# Patient Record
Sex: Male | Born: 2000 | Race: Black or African American | Hispanic: No | Marital: Single
Health system: Midwestern US, Community
[De-identification: ages and names within clinical notes are randomized; demographics above are authoritative.]

---
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
## 2020-01-22 ENCOUNTER — Emergency Department: Admit: 2020-01-22 | Payer: MEDICAID

## 2020-01-22 ENCOUNTER — Inpatient Hospital Stay
Admit: 2020-01-22 | Discharge: 2020-01-23 | Disposition: A | Discharge status: Home / Self Care | Payer: MEDICAID | Attending: Emergency Medicine

## 2020-01-22 DIAGNOSIS — M79662 Pain in left lower leg: Secondary | ICD-10-CM

## 2020-01-22 MED ORDER — IBUPROFEN 800 MG TAB
800 mg | ORAL_TABLET | Freq: Three times a day (TID) | ORAL | 0 refills | Status: AC
Start: 2020-01-22 — End: 2020-01-27

## 2020-01-22 NOTE — ED Provider Notes (Addendum)
EMERGENCY DEPARTMENT HISTORY AND PHYSICAL EXAM    Date: 01/22/2020  Patient Name: Cody Moore    History of Presenting Illness     Chief Complaint   Patient presents with   ??? Leg Pain         History Provided By: Patient      Additional History (Context): Cody Moore is a 19 y.o. male with No significant past medical history who presents with complaint of left calf pain for 2 weeks.  Denies shortness of breath or chest pain or injury.  Had a PVL performed in [facility] at onset of symptoms but is still symptomatic.  Pain worse w/ambulating.    PCP: None        Past History     Past Medical History:  No past medical history on file.    Past Surgical History:  No past surgical history on file.    Family History:  No family history on file.    Social History:  Social History     Tobacco Use   ??? Smoking status: Not on file   Substance Use Topics   ??? Alcohol use: Not on file   ??? Drug use: Not on file       Allergies:  No Known Allergies      Review of Systems   Review of Systems   Respiratory: Negative for shortness of breath.    Cardiovascular: Negative for chest pain.   Musculoskeletal: Positive for gait problem and myalgias.   Skin: Negative for color change, rash and wound.   Neurological: Negative for weakness and numbness.     All Other Systems Negative  Physical Exam     Vitals:    01/22/20 1556   BP: 129/77   Pulse: 81   Resp: 16   Temp: 98.6 ??F (37 ??C)   SpO2: 99%   Weight: 74.4 kg (164 lb)   Height: 6\' 1"  (1.854 m)     Physical Exam  Vitals signs and nursing note reviewed.   Constitutional:       General: He is not in acute distress.     Appearance: He is well-developed. He is not ill-appearing, toxic-appearing or diaphoretic.   HENT:      Head: Normocephalic and atraumatic.   Neck:      Musculoskeletal: Normal range of motion and neck supple.      Thyroid: No thyromegaly.      Vascular: No carotid bruit.      Trachea: No tracheal deviation.   Cardiovascular:       Rate and Rhythm: Normal rate and regular rhythm.      Heart sounds: Normal heart sounds. No murmur. No friction rub. No gallop.    Pulmonary:      Effort: Pulmonary effort is normal. No respiratory distress.      Breath sounds: Normal breath sounds. No stridor. No wheezing or rales.   Chest:      Chest wall: No tenderness.   Abdominal:      General: There is no distension.      Palpations: Abdomen is soft. There is no mass.      Tenderness: There is no abdominal tenderness. There is no guarding or rebound.   Musculoskeletal: Normal range of motion.         General: Tenderness present.      Comments: Left calf tenderness to touch.  Achilles tendon is intact.  Nontender Achilles insertion sites.  DP PT pulses palpable.  Nontender ankle and knee.   Skin:     General: Skin is warm and dry.      Coloration: Skin is not pale.   Neurological:      Mental Status: He is alert and oriented to person, place, and time.   Psychiatric:         Speech: Speech normal.         Behavior: Behavior normal.         Thought Content: Thought content normal.         Judgment: Judgment normal.          Diagnostic Study Results     Labs -   No results found for this or any previous visit (from the past 12 hour(s)).    Radiologic Studies -   No orders to display     CT Results  (Last 48 hours)    None        CXR Results  (Last 48 hours)    None            Medical Decision Making   I am the first provider for this patient.    I reviewed the vital signs, available nursing notes, past medical history, past surgical history, family history and social history.    Vital Signs-Reviewed the patient's vital signs.    Procedures:  Procedures    Provider Notes (Medical Decision Making): DVT verus Achilles tendinitis.  Tendon is intact but he has pain w/moving it and to touch.  PVL tech responded to page and pt updated she is arriving to do study.     Could have PVL.  Send ibuprofen to preferred pharmacy wrote work note and return to Ortho as needed for further pain episodes.      MED RECONCILIATION:  No current facility-administered medications for this encounter.      No current outpatient medications on file.       Diagnosis:   1. Pain of left calf          Disposition: home    Follow-up Information     Follow up With Specialties Details Why Contact Info    Richardo Hanks, MD Orthopedic Surgery Schedule an appointment as soon as possible for a visit in 1 day As needed [place] [address]  [phone]      [facility] EMERGENCY DEPT Emergency Medicine  If symptoms worsen return immediately [place] [address]  [phone]          Patient's Medications   Start Taking    IBUPROFEN (MOTRIN) 800 MG TABLET    Take 1 Tab by mouth every eight (8) hours for 5 days.   Continue Taking    No medications on file   These Medications have changed    No medications on file   Stop Taking    No medications on file           Follow-up Information    None         There are no discharge medications for this patient.        Diagnosis     Clinical Impression:   1. Pain of left calf

## 2020-01-22 NOTE — ED Triage Notes (Addendum)
Pt was walking to work today and started having left calf pain. Pt has old calf injury "ripped muscle" x8 months ago - pt states he thinks this is an exacerbation of his old injury.

## 2020-01-22 NOTE — ED Notes (Signed)
 Pt was walking to work today and started having left calf pain. Pt has old calf injury ripped muscle x8 months ago - pt states he thinks this is an exacerbation of his old injury.

## 2020-01-22 NOTE — ED Provider Notes (Signed)
ED Provider Notes by Cody Foster, PA at 01/22/20 1608                Author: Eulis Foster, PA  Service: EMERGENCY  Author Type: Physician Assistant       Filed: 01/22/20 1807  Date of Service: 01/22/20 1608  Status: Attested Addendum          Editor: Cody Foster, PA (Physician Assistant)       Related Notes: Original Note by Cody Moore, [place] (Physician Assistant) filed at 01/22/20 1802          Cosigner: Jacinto Reap, MD at 01/25/20 [phone]          Attestation signed by Jacinto Reap, MD at 01/25/20 [phone]          I was in the department and available for consult.       Cody Kiel, MD                                    EMERGENCY DEPARTMENT HISTORY AND PHYSICAL EXAM      Date: 01/22/2020   Patient Name: Cody Moore        History of Presenting Illness          Chief Complaint       Patient presents with        ?  Leg Pain              History Provided By: Patient         Additional History (Context): ELIGIO Moore  is a 19 y.o. male  with No significant past medical history who presents with complaint of left  calf pain for 2 weeks.  Denies shortness of breath or chest pain or injury.  Had a PVL performed in [facility] at onset of symptoms but is still symptomatic.  Pain worse w/ambulating.      PCP: None              Past History        Past Medical History:   No past medical history on file.      Past Surgical History:   No past surgical history on file.      Family History:   No family history on file.      Social History:     Social History          Tobacco Use         ?  Smoking status:  Not on file       Substance Use Topics         ?  Alcohol use:  Not on file         ?  Drug use:  Not on file           Allergies:   No Known Allergies           Review of Systems     Review of Systems    Respiratory: Negative for shortness of breath.     Cardiovascular: Negative for chest pain.    Musculoskeletal: Positive for gait problem and myalgias .    Skin: Negative for color change,  rash and wound.    Neurological: Negative for weakness and numbness.       All Other Systems Negative     Physical Exam  Vitals:          01/22/20 1556        BP:  129/77     Pulse:  81     Resp:  16     Temp:  98.6 ??F (37 ??C)     SpO2:  99%     Weight:  74.4 kg (164 lb)        Height:  6\' 1"  (1.854 m)        Physical Exam   Vitals signs and nursing note reviewed.   Constitutional:        General: He is not in acute distress.     Appearance: He is well-developed. He is not ill-appearing, toxic-appearing or diaphoretic.    HENT:       Head: Normocephalic and atraumatic.   Neck:       Musculoskeletal: Normal range of motion and neck supple.      Thyroid: No thyromegaly.      Vascular: No carotid bruit.      Trachea: No tracheal deviation.   Cardiovascular:       Rate and Rhythm: Normal rate and regular rhythm.      Heart sounds: Normal heart sounds. No murmur. No friction rub. No gallop.     Pulmonary:       Effort: Pulmonary effort is normal. No respiratory distress.      Breath sounds: Normal breath sounds. No stridor. No wheezing or  rales.   Chest :       Chest wall: No tenderness.   Abdominal :      General: There is no distension.      Palpations: Abdomen is soft. There is no mass.      Tenderness: There is no abdominal tenderness. There is no guarding or rebound.     Musculoskeletal: Normal range of motion.          General: Tenderness present.      Comments: Left calf tenderness to touch.   Achilles tendon is intact.  Nontender Achilles insertion sites.  DP PT pulses palpable.  Nontender ankle and knee.    Skin:      General: Skin is warm and dry.      Coloration: Skin is not pale.    Neurological:       Mental Status: He is alert and oriented to person, place, and time.    Psychiatric:         Speech: Speech normal.         Behavior: Behavior normal.         Thought Content: Thought content normal.         Judgment: Judgment normal.                Diagnostic Study Results        Labs -    No results  found for this or any previous visit (from the past 12 hour(s)).      Radiologic Studies -      No orders to display          CT Results   (Last 48 hours)          None                 CXR Results   (Last 48 hours)          None  Medical Decision Making     I am the first provider for this patient.      I reviewed the vital signs, available nursing notes, past medical history, past surgical history, family history and social history.      Vital Signs-Reviewed the patient's vital signs.      Procedures:   Procedures      Provider Notes (Medical Decision Making): DVT verus Achilles tendinitis.  Tendon is intact but he has pain w/moving  it and to touch.  PVL tech responded to page and pt updated she is arriving to do study.      Could have PVL.  Send ibuprofen to preferred pharmacy wrote work note and return to Ortho as needed for further pain episodes.         MED RECONCILIATION:     No current facility-administered medications for this encounter.           No current outpatient medications on file.           Diagnosis:       1.  Pain of left calf               Disposition: home        Follow-up Information               Follow up With  Specialties  Details  Why  Contact Info              Bettina Gavia, MD  Orthopedic Surgery  Schedule an appointment as soon as possible for a visit in 1 day  As needed  [address]    [address]   [place] [address]   [phone]                 [facility] EMERGENCY DEPT  Emergency Medicine    If symptoms worsen return immediately  [address] [facility] [id number]   [phone]                  Patient's Medications       Start Taking           IBUPROFEN (MOTRIN) 800 MG TABLET     Take 1 Tab by mouth every eight (8) hours for 5 days.       Continue Taking          No medications on file       These Medications have changed          No medications on file       Stop Taking          No medications on file                   Follow-up Information       None                There are no discharge medications for this patient.              Diagnosis        Clinical Impression:       1.  Pain of left calf

## 2020-06-26 ENCOUNTER — Encounter: Payer: Self-pay | Admitting: Emergency Medicine
